# Patient Record
Sex: Male | Born: 1970 | Race: White | Hispanic: No | Marital: Married
Health system: Southern US, Community
[De-identification: ages and names within clinical notes are randomized; demographics above are authoritative.]

---

## 2009-12-07 ENCOUNTER — Emergency Department: Payer: Self-pay | Admitting: Emergency Medicine

## 2009-12-16 ENCOUNTER — Emergency Department (HOSPITAL_COMMUNITY): Admission: EM | Admit: 2009-12-16 | Discharge: 2009-12-16 | Payer: Self-pay | Admitting: Emergency Medicine

## 2012-04-04 ENCOUNTER — Emergency Department: Payer: Self-pay | Admitting: Emergency Medicine

## 2014-11-02 ENCOUNTER — Emergency Department: Payer: Self-pay | Admitting: Emergency Medicine
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
## 2015-11-23 IMAGING — CR LEFT THUMB 2+V
1 series · 3 of 3 positions shown · non-contrast
Comparison: Left hand radiographs performed 04/04/2012

CLINICAL DATA: Injury to left thumb, with staple embedded in the
left thumb. Initial encounter.

EXAM:
LEFT THUMB 2+V

[Series 1: pa · 0.17mm/px · 3 of 3 slices shown]
[im 1/3]
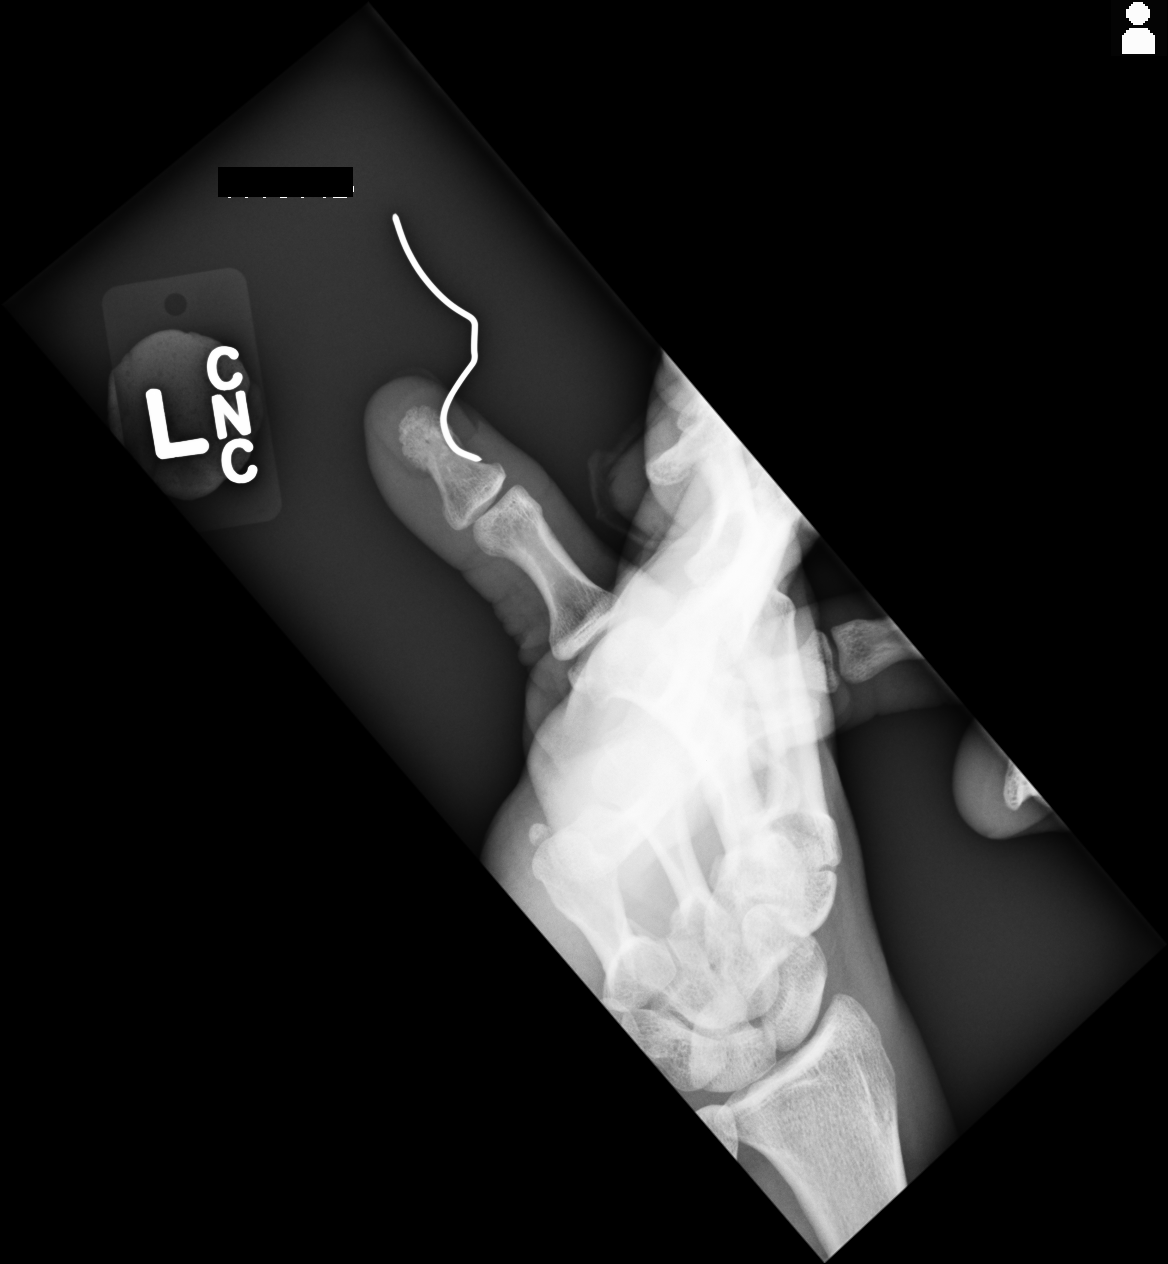
[im 2/3]
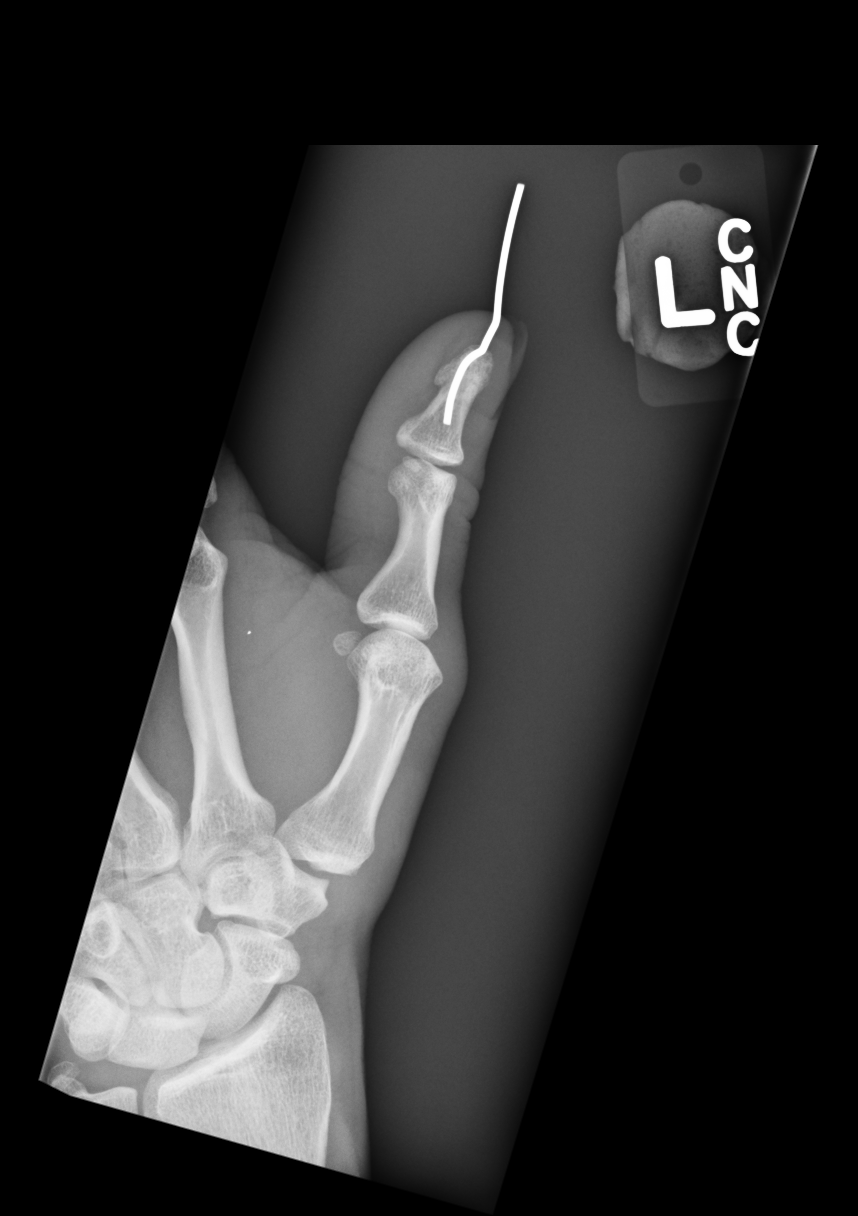
[im 3/3]
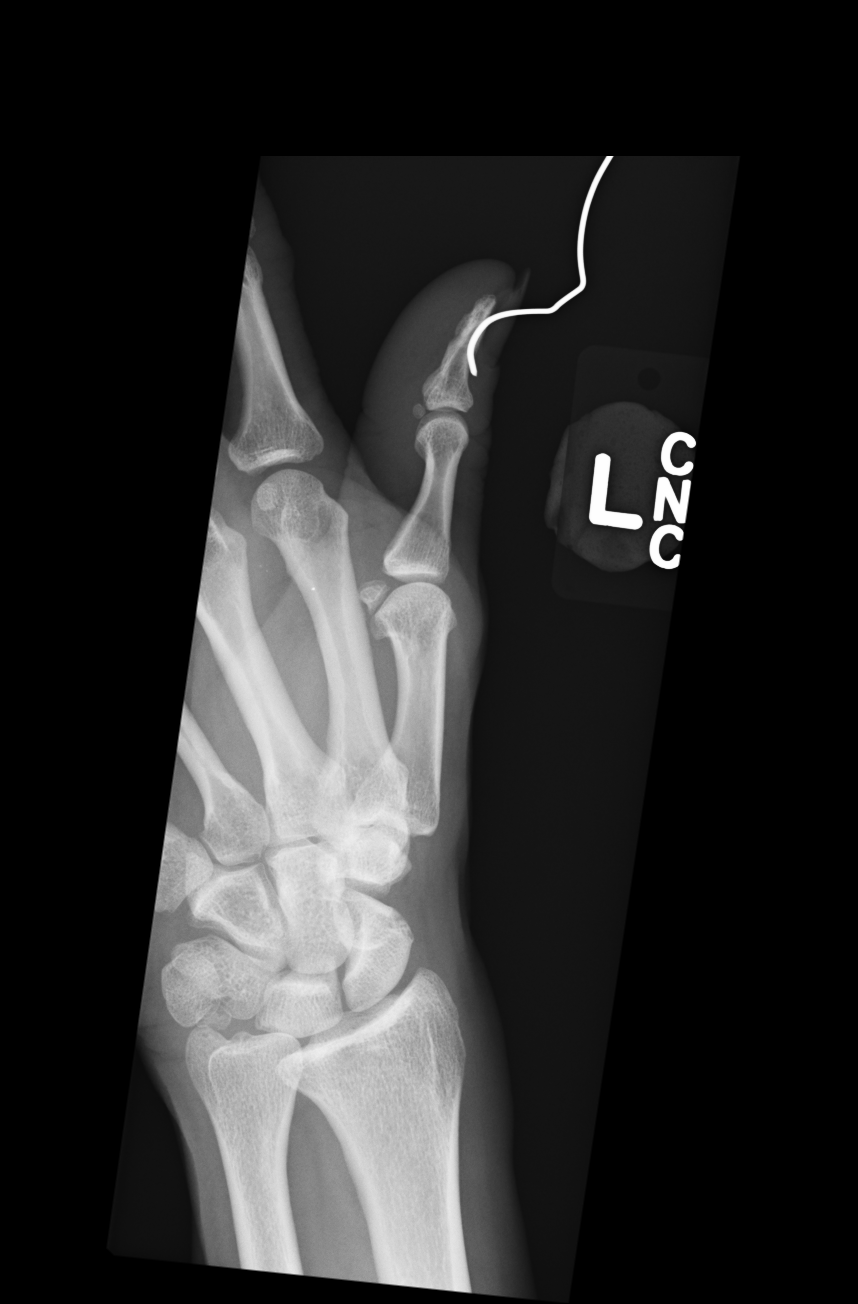

[3 of 3 positions shown; findings below may reference images not displayed]

FINDINGS: There is a staple embedded in the dorsal surface of the left thumb,
apparently extending across the nailbed.

The staple likely tracks just along the dorsal edge of the distal
phalanx, though bony involvement cannot be excluded. No definite
fracture line is seen. Visualized joint spaces are grossly
unremarkable. No additional radiopaque foreign bodies are seen.
IMPRESSION: 1. Staple embedded in the dorsal surface of the left thumb,
apparently extending across the nailbed.
2. Stable likely tracks just adjacent to the dorsal edge of the
distal phalanx, though bony involvement cannot be excluded. No
definite fracture line seen.
